# Patient Record
Sex: Male | Born: 2000 | Race: Black or African American | Hispanic: No | Marital: Single | Smoking: Never smoker
Health system: Southern US, Community
[De-identification: ages and names within clinical notes are randomized; demographics above are authoritative.]

---
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
## 2019-11-24 IMAGING — DX DG NASAL BONES 3+V
3 series · 3 of 3 positions shown · non-contrast
Comparison: None.

CLINICAL DATA: Patient states that he was elbowed in the nose today
during football practice, bleeding and pain in the nose. No hx

EXAM:
NASAL BONES - 3+ VIEW

[[person_name]]
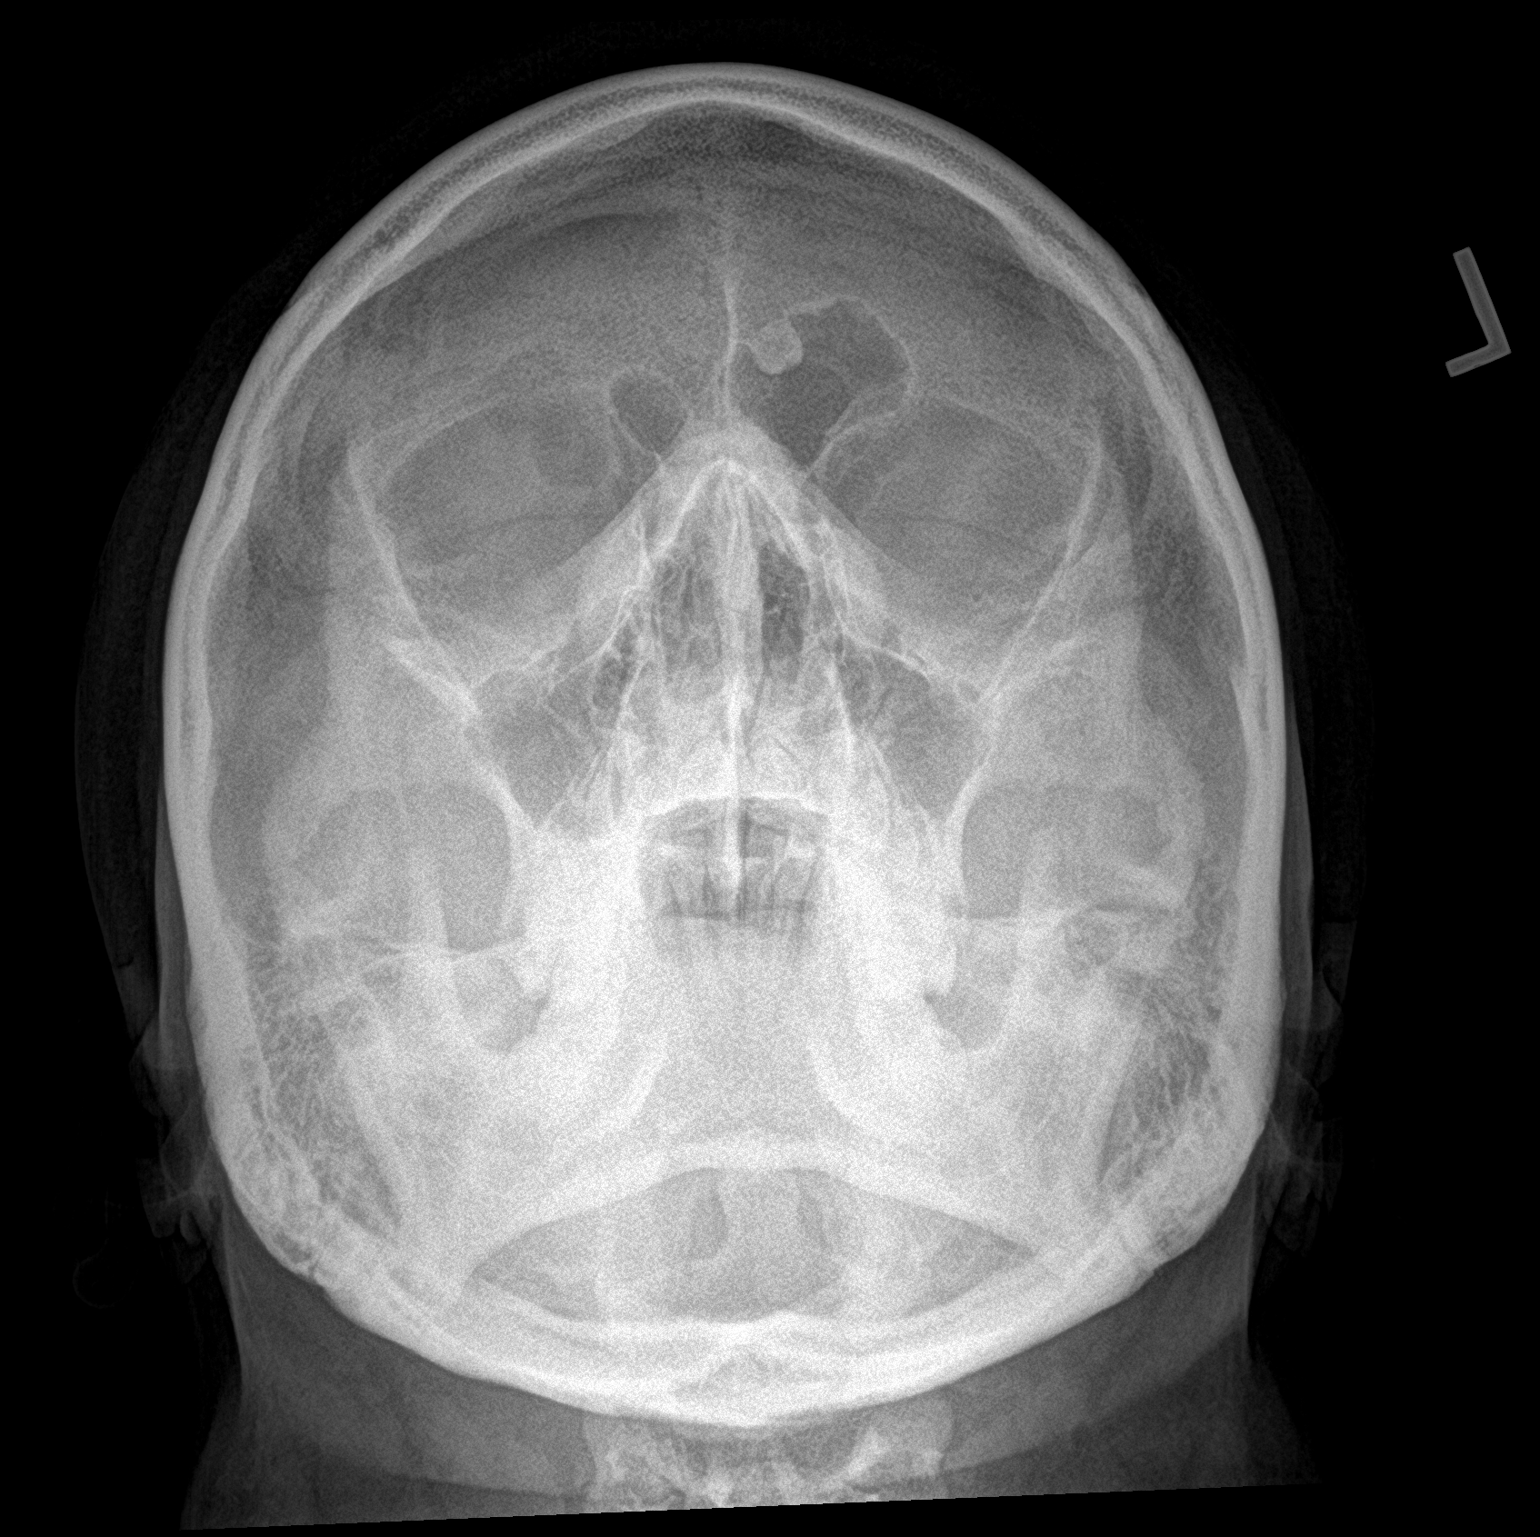

[nasal lat (1 of 2)]
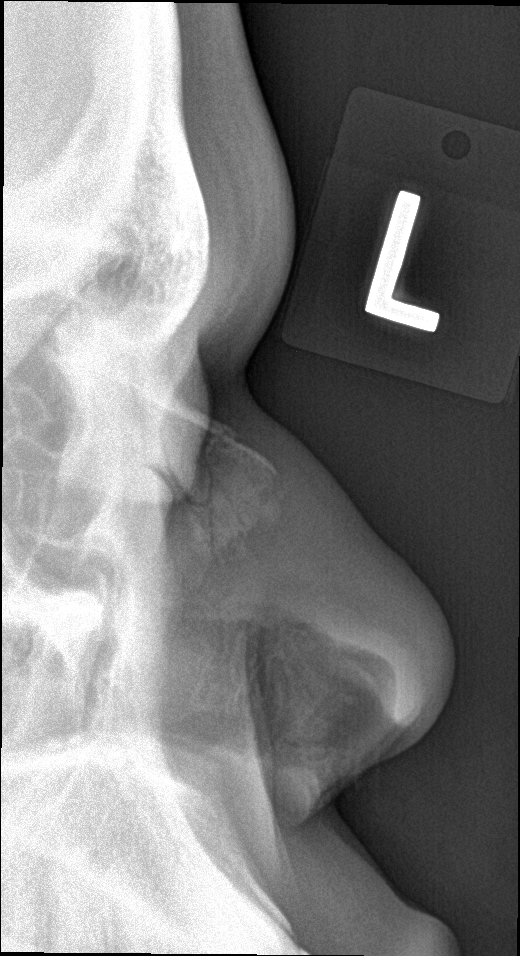

[nasal lat (2 of 2)]
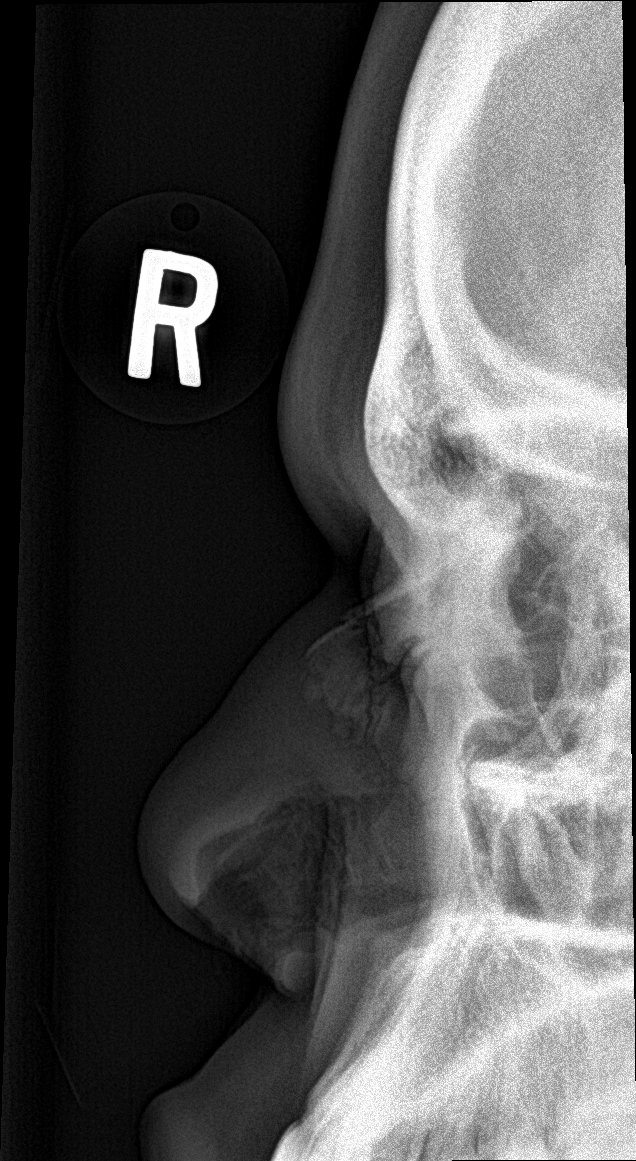

[3 of 3 positions shown; findings below may reference images not displayed]

FINDINGS: There is an acute fracture of the nasal bones bilaterally,
associated with minimal displacement and significant soft tissue
swelling.
IMPRESSION: Bilateral nasal bone fractures.

## 2021-07-23 ENCOUNTER — Encounter (HOSPITAL_COMMUNITY): Payer: Self-pay | Admitting: Emergency Medicine

## 2021-07-23 ENCOUNTER — Ambulatory Visit (HOSPITAL_COMMUNITY)
Admission: EM | Admit: 2021-07-23 | Discharge: 2021-07-23 | Disposition: A | Discharge status: Home / Self Care | Payer: BC Managed Care – PPO | Attending: Physician Assistant | Admitting: Physician Assistant

## 2021-07-23 DIAGNOSIS — R509 Fever, unspecified: Secondary | ICD-10-CM | POA: Diagnosis not present

## 2021-07-23 DIAGNOSIS — G9331 Postviral fatigue syndrome: Secondary | ICD-10-CM | POA: Insufficient documentation

## 2021-07-23 DIAGNOSIS — Z792 Long term (current) use of antibiotics: Secondary | ICD-10-CM | POA: Insufficient documentation

## 2021-07-23 DIAGNOSIS — J029 Acute pharyngitis, unspecified: Secondary | ICD-10-CM | POA: Insufficient documentation

## 2021-07-23 DIAGNOSIS — Z20822 Contact with and (suspected) exposure to covid-19: Secondary | ICD-10-CM | POA: Insufficient documentation

## 2021-07-23 DIAGNOSIS — J020 Streptococcal pharyngitis: Secondary | ICD-10-CM | POA: Diagnosis not present

## 2021-07-23 LAB — POCT RAPID STREP A, ED / UC: Streptococcus, Group A Screen (Direct): NEGATIVE

## 2021-07-23 MED ORDER — LIDOCAINE VISCOUS HCL 2 % MT SOLN: 5.0000 mL | Freq: Four times a day (QID) | OROMUCOSAL | 0 refills | Status: AC

## 2021-07-23 MED ORDER — AMOXICILLIN 500 MG PO CAPS: 500.0000 mg | ORAL_CAPSULE | Freq: Three times a day (TID) | ORAL | 0 refills | Status: AC

## 2021-07-23 NOTE — Discharge Instructions (Addendum)
Advised to take the amoxicillin 500 mg 1 3 times a day until completed to treat the strep throat. Advised to use the Magic mouthwash gargles to help soothe the pain of the sore throat. Advised to continue taking ibuprofen or Tylenol to help reduce the pain the sore throat. Advised to follow-up PCP or return to urgent care if symptoms fail to improve.

## 2021-07-23 NOTE — ED Triage Notes (Signed)
Pt having a sore throat for several days. Reports taking ibuprofen and OTC meds without much relief. Feels stiffness in neck and swollen lymph nodes in neck. Having chills and sweats, not much appetite. Adds has lots of drainage

## 2021-07-23 NOTE — ED Provider Notes (Signed)
[facility]    CSN: [id number] Arrival date & time: 07/23/21  1615      History   Chief Complaint Chief Complaint  Patient presents with   Sore Throat    HPI Dean Huber is a 21 y.o. male.   21 year old presents with sore throat and fever.  Relates for the past several days she has been having increasing sore throat, painful swallowing, with fever ranging from 99-1 01.  Indicates that she is having difficulty swallowing due to the pain, and also his neck hurts due to the throat pain.  Patient indicates that he has mild upper respiratory symptoms of sinus congestion, postnasal drip, and rhinitis.  Patient relates that he has mild chest congestion with cough and that it hurts his throat to cough up the phlegm which is yellow.  Patient relates he has not been around anyone with strep or COVID.  Patient relates he has been taking Advil but it only offers minimal relief.  Patient denies shortness of breath and no nausea or vomiting.   Sore Throat    History reviewed. No pertinent past medical history.  There are no problems to display for this patient.   History reviewed. No pertinent surgical history.     Home Medications    Prior to Admission medications   Medication Sig Start Date End Date Taking? Authorizing Provider  amoxicillin (AMOXIL) 500 MG capsule Take 1 capsule (500 mg total) by mouth 3 (three) times daily. 07/23/21  Yes Ellsworth Lennox, PA-C  magic mouthwash (lidocaine, diphenhydrAMINE, alum & mag hydroxide) suspension Swish and swallow 5 mLs 4 (four) times daily. 07/23/21  Yes Ellsworth Lennox, PA-C  methylphenidate (RITALIN) 20 MG tablet Take 20 mg by mouth 2 (two) times daily.    [provider]    Family History No family history on file.  Social History     Allergies   Patient has no known allergies.   Review of Systems Review of Systems  HENT:  Positive for postnasal drip, sinus pressure and sore throat.      Physical  Exam Triage Vital Signs ED Triage Vitals  Enc Vitals Group     BP 07/23/21 1636 131/67     Pulse Rate 07/23/21 1636 77     Resp 07/23/21 1636 16     Temp 07/23/21 1636 99.3 F (37.4 C)     Temp Source 07/23/21 1636 Oral     SpO2 07/23/21 1636 100 %     Weight --      Height --      Head Circumference --      Peak Flow --      Pain Score 07/23/21 1635 8     Pain Loc --      Pain Edu? --      Excl. in GC? --    No data found.  Updated Vital Signs BP 131/67 (BP Location: Left Arm)   Pulse 77   Temp 99.3 F (37.4 C) (Oral)   Resp 16   SpO2 100%   Visual Acuity Right Eye Distance:   Left Eye Distance:   Bilateral Distance:    Right Eye Near:   Left Eye Near:    Bilateral Near:     Physical Exam Constitutional:      Appearance: He is well-developed.  HENT:     Right Ear: Tympanic membrane and ear canal normal.     Left Ear: Tympanic membrane and ear canal normal.     Mouth/Throat:  Mouth: Mucous membranes are moist.     Pharynx: Pharyngeal swelling, oropharyngeal exudate and posterior oropharyngeal erythema present.     Tonsils: Tonsillar exudate present.  Cardiovascular:     Rate and Rhythm: Normal rate and regular rhythm.     Heart sounds: Normal heart sounds.  Pulmonary:     Effort: Pulmonary effort is normal.     Breath sounds: Normal breath sounds and air entry. No wheezing, rhonchi or rales.  Lymphadenopathy:     Cervical: No cervical adenopathy.  Neurological:     Mental Status: He is alert.      UC Treatments / Results  Labs (all labs ordered are listed, but only abnormal results are displayed) Labs Reviewed  SARS CORONAVIRUS 2 (TAT 6-24 HRS)  CULTURE, GROUP A STREP [facility])  POCT RAPID STREP A, ED / UC    EKG   Radiology No results found.  Procedures Procedures (including critical care time)  Medications Ordered in UC Medications - No data to display  Initial Impression / Assessment and Plan / UC Course  I have reviewed the  triage vital signs and the nursing notes.  Pertinent labs & imaging results that were available during my care of the patient were reviewed by me and considered in my medical decision making (see chart for details).    Plan: 1.  Advised take the amoxicillin 500 mg 1 3 times a day until completed to treat strep throat. 2.  Advised to use the Magic mouthwash gargles to help soothe the pain the sore throat along with lozenges 3.  Advised to take ibuprofen or Tylenol for pain relief and to control the fever. 4.  Covid test is pending 5.  Advised to follow-up with PCP or return to urgent care if symptoms fail to improve Final Clinical Impressions(s) / UC Diagnoses   Final diagnoses:  Sore throat  Fever, unspecified  Postviral fatigue syndrome  Streptococcal sore throat     Discharge Instructions      Advised to take the amoxicillin 500 mg 1 3 times a day until completed to treat the strep throat. Advised to use the Magic mouthwash gargles to help soothe the pain of the sore throat. Advised to continue taking ibuprofen or Tylenol to help reduce the pain the sore throat. Advised to follow-up PCP or return to urgent care if symptoms fail to improve.     ED Prescriptions     Medication Sig Dispense Auth. Provider   magic mouthwash (lidocaine, diphenhydrAMINE, alum & mag hydroxide) suspension Swish and swallow 5 mLs 4 (four) times daily. 100 mL Ellsworth Lennox, PA-C   amoxicillin (AMOXIL) 500 MG capsule Take 1 capsule (500 mg total) by mouth 3 (three) times daily. 21 capsule Ellsworth Lennox, PA-C      PDMP not reviewed this encounter.   Ellsworth Lennox, PA-C 07/23/21 1718

## 2021-07-24 LAB — SARS CORONAVIRUS 2 (TAT 6-24 HRS): SARS Coronavirus 2: NEGATIVE

## 2021-07-26 LAB — CULTURE, GROUP A STREP (THRC)

## 2023-03-04 NOTE — Progress Notes (Signed)
 [address] - AMBULATORY [facility]  - URGENT CARE [facility] [facility] [address] [place] [address]  Date of Service: 03/04/2023 Patient DOB: Mar 09, 2000    History of Present Illness   Patient ID: Dean Huber is a 23 y.o. male. Patient comes in for STI Screening (Patient is requesting a full STI screening. Patient denies symptoms at this time. Patient just found out his girlfriend has an STI, but is unsure which one. ) .  HPI  Dean Huber is a 23 year old male who is here because he was notified by his current girlfriend that she tested positive for a STI.  She did not provide any further information as to whether this was syphilis, HIV, gonorrhea, chlamydia, or trichomoniasis.  He did not inquire further as he was upset with the text message and general situation.  Girlfriend was asymptomatic and tested positive during a pregnancy evaluation it sounds like.  He is asymptomatic.  He is in monogamous relationship with current girlfriend. History of Present Illness   Past Medical History:  Diagnosis Date   Attention deficit disorder with hyperactivity(314.01)     Review of Systems   Review of Systems  Constitutional: Negative.   HENT: Negative.    Eyes: Negative.   Respiratory: Negative.    Cardiovascular: Negative.   Gastrointestinal: Negative.   Endocrine: Negative.   Genitourinary: Negative.   Musculoskeletal: Negative.   Skin: Negative.   Allergic/Immunologic: Negative.   Neurological: Negative.   Hematological: Negative.   Psychiatric/Behavioral: Negative.    All other systems reviewed and are negative.   Physicial Exam   Physical Exam Vitals and nursing note reviewed.  Constitutional:      General: He is not in acute distress.    Appearance: Normal appearance. He is not ill-appearing, toxic-appearing or diaphoretic.  HENT:     Head: Normocephalic and atraumatic.     Right Ear: Tympanic membrane, ear  canal and external ear normal.     Left Ear: Tympanic membrane, ear canal and external ear normal.     Nose: Nose normal.     Mouth/Throat:     Pharynx: Oropharynx is clear.  Eyes:     Conjunctiva/sclera: Conjunctivae normal.  Cardiovascular:     Rate and Rhythm: Normal rate and regular rhythm.     Pulses: Normal pulses.     Heart sounds: Normal heart sounds.  Pulmonary:     Effort: Pulmonary effort is normal.     Breath sounds: Normal breath sounds.  Neurological:     Mental Status: He is alert.     Vitals:   03/04/23 1945  BP: 132/78  Pulse: 91  Resp: 15  Temp: 98.4 F (36.9 C)  SpO2: 100%     Diagnosis   Cailan was seen today for sti screening.  Diagnoses and all orders for this visit:  Possible exposure to STI -     Trichomonas vaginalis, Qualitative NAAT -     Chlamydia / Gonococcus (GC), NAAT; Future     Medical Decision Making    Urgent Care Disposition:  Home Care  Patient cleared for discharge. Discussed return precautions. Instructed on supportive care measures - tylenol/motrin for fever or discomfort, ensure adequate hydration, good hand hygiene to prevent spread of illness. Plan to follow up with PCP in 2-4 days if symptoms are not improving.   If showing URI symptoms, pt was examined in full PPE; mask, goggles, face shield, gown, and gloves.  Hands washed properly before and  after encounter with patient.  Labs   No visits with results within 3 Day(s) from this visit.  Latest known visit with results is:  No results found for any previous visit.    Imaging   No results found for this or any previous visit (from the past 750 hours).  Discharge Information   Symptomatic management discussed.  Patient was given verbal and written instructions on symptoms that necessitate return to the UC/ED, and instructed to f/u w/ UC or PCP if not improving in expected timeframe.   Patient/parent has been instructed on RX/OTC medications, dosages, side  effects, and possible interactions as associated with each diagnosis in my impression and plan above.   Patient education (verbal/handout) given on diagnosis, pathophysiology, treatment of diagnosis, side effects of medication use for treatment, restrictions while taking medication, supportives measures such as staying hydrated. No AVS printed IF Pt opted to receive AVS online via [facility] app and/or [email or web address].    Red Flags associated with diagnosis/es were reviewed and patient instructed on action plan if red flags develop.   They have been instructed that if symptoms worsen or red flags develop they should return to Urgent Care, go to the nearest ED, or activate EMS/911.     Patient and/or parent/guardian (if applicable) agreed with plan and voiced understanding.  No barriers to adherence perceived by myself.  If a new prescription was given today, then I discussed potential side effects, drug interactions, instructions for taking the medication, and the consequences of not taking it.    F/u: Follow up closely with primary care provider (PCP) and other specialists for further care and routine care, but seek medical attention sooner if worsening/concerning signs or symptoms.  Shared decision making with the patient regarding empirically treating him for gonorrhea/chlamydia coverage.  He would like to be tested first and then treated if positive.  Risk/benefits discussed with patient and he verbalized understanding.  We will send off a urine gonorrhea, chlamydia, and trichomonas panel.  He declined HIV and syphilis testing.   Electronically signed by: Caprice Ezra Sheller, NP 03/04/2023 8:21 PM

## 2023-12-31 ENCOUNTER — Ambulatory Visit: Payer: Self-pay

## 2023-12-31 ENCOUNTER — Ambulatory Visit
Admission: EM | Admit: 2023-12-31 | Discharge: 2023-12-31 | Disposition: A | Discharge status: Home / Self Care | Attending: Emergency Medicine | Admitting: Emergency Medicine

## 2023-12-31 ENCOUNTER — Encounter: Payer: Self-pay | Admitting: Emergency Medicine

## 2023-12-31 DIAGNOSIS — L732 Hidradenitis suppurativa: Secondary | ICD-10-CM | POA: Diagnosis not present

## 2023-12-31 MED ORDER — DOXYCYCLINE HYCLATE 100 MG PO TABS: 100.0000 mg | ORAL_TABLET | Freq: Two times a day (BID) | ORAL | 0 refills | Status: AC

## 2023-12-31 NOTE — Discharge Instructions (Signed)
 I have enclosed information about hidradenitis suppurativa at the hip you find helpful.  During your visit today, you received an injection of a steroid called Kenalog and a numbing medication called lidocaine  into the lesion.  The next 12 to 24 hours, you will see a significant reduction in the size and discomfort of the lesion.  I also recommend that you begin a 7-day course of doxycycline to calm the skin and to prevent infection in the lesion as well.  I have sent a prescription to your pharmacy.  Please take 1 tablet twice daily.  In the future, when these lesions appear, it would be very good if you have established care with a dermatologist.  Typically dermatologist will work you in same-day or next day to provide you with an injection of the lesion much like we did today.  Sometimes primary care providers will do this for you.  Sometimes urgent care providers will do this for you.  I will be happy to do this anytime you come in to this location.  Thank you for visiting [facility] today.  We appreciate the opportunity to participate in your care.

## 2023-12-31 NOTE — ED Triage Notes (Signed)
 Pt presents to UC with c/o an abscess under the left axilla. States the abscess has been there awhile but is now starting to be red, swollen and cause pain. Has tried warm compresses with some relief.

## 2023-12-31 NOTE — ED Provider Notes (Signed)
 "    Dean Huber    CSN: [id number] Arrival date & time: 12/31/23  1444    HISTORY   Chief Complaint  Patient presents with   Abscess   HPI Dean Huber is a pleasant, 23 y.o. male who presents to urgent care today. Patient complains of a lesion at his left axilla.  Patient states the abscess itself has been there for a while but over the past day or 2, it has become more red, swollen and painful.  Patient states he has tried warm compresses to the area with some relief.  Patient states he has never had a similar lesion in the past.  Patient states the lesion has not drained.  The history is provided by the patient.  Abscess  History reviewed. No pertinent past medical history. There are no active problems to display for this patient.  History reviewed. No pertinent surgical history.  Home Medications    Prior to Admission medications  Medication Sig Start Date End Date Taking? Authorizing Provider  doxycycline (VIBRA-TABS) 100 MG tablet Take 1 tablet (100 mg total) by mouth 2 (two) times daily for 7 days. 12/31/23 01/07/24 Yes Joesph Shaver Scales, PA-C  amoxicillin  (AMOXIL ) 500 MG capsule Take 1 capsule (500 mg total) by mouth 3 (three) times daily. 07/23/21   Lynwood Lenis, PA-C  magic mouthwash (lidocaine , diphenhydrAMINE, alum & mag hydroxide) suspension Swish and swallow 5 mLs 4 (four) times daily. 07/23/21   Lynwood Lenis, PA-C  methylphenidate (RITALIN) 20 MG tablet Take 20 mg by mouth 2 (two) times daily.    [provider]    Family History History reviewed. No pertinent family history. Social History Social History[1] Allergies   Patient has no known allergies.  Review of Systems Review of Systems Pertinent findings revealed after performing a 14 point review of systems has been noted in the history of present illness.  Physical Exam Vital Signs BP 117/70 (BP Location: Right Arm)   Pulse 77   Temp 98.6 F (37 C) (Oral)   Resp 18   SpO2 97%    No data found.  Physical Exam Vitals and nursing note reviewed.  Constitutional:      General: He is awake. He is not in acute distress.    Appearance: Normal appearance. He is well-developed and well-groomed. He is not ill-appearing.  Skin:    Findings: Lesion (Left axilla, 2 cm x 2 cm with central punctate, area is nonfluctuant, warm to touch, mildly erythematous, no drainage appreciated) present.  Neurological:     Mental Status: He is alert.  Psychiatric:        Behavior: Behavior is cooperative.     Visual Acuity Right Eye Distance:   Left Eye Distance:   Bilateral Distance:    Right Eye Near:   Left Eye Near:    Bilateral Near:     Huber Couse / Diagnostics / Procedures:     Radiology No results found.  Procedures Wound Care  Date/Time: 12/31/2023 4:25 PM  Performed by: Joesph Shaver Scales, PA-C Authorized by: Joesph Shaver Scales, PA-C   Comments:     Lesion of left axilla was injected with 0.5 cc of a mixture of 1 part Kenalog 40 mg/mL and 3 parts lidocaine  2%.  Patient tolerated well.  (including critical care time) EKG  Pending results:  Labs Reviewed - No data to display  Medications Ordered in Huber: Medications - No data to display  Huber Diagnoses / Final Clinical Impressions(s)  I have reviewed the triage vital signs and the nursing notes.  Pertinent labs & imaging results that were available during my care of the patient were reviewed by me and considered in my medical decision making (see chart for details).    Final diagnoses:  Hidradenitis suppurativa of left axilla   Patient was provided with an intralesional corticosteroid injection and a 7-day course of doxycycline.  Patient encouraged to reach out to primary care provider to discuss referral to dermatology for management of future lesions.  Conservative care recommended.  Return precautions advised.  Please see discharge instructions below for details of plan of care as provided to  patient. ED Prescriptions     Medication Sig Dispense Auth. Provider   doxycycline (VIBRA-TABS) 100 MG tablet Take 1 tablet (100 mg total) by mouth 2 (two) times daily for 7 days. 14 tablet Joesph Shaver Scales, PA-C      PDMP not reviewed this encounter.    Discharge Instructions      I have enclosed information about hidradenitis suppurativa at the hip you find helpful.  During your visit today, you received an injection of a steroid called Kenalog and a numbing medication called lidocaine  into the lesion.  The next 12 to 24 hours, you will see a significant reduction in the size and discomfort of the lesion.  I also recommend that you begin a 7-day course of doxycycline to calm the skin and to prevent infection in the lesion as well.  I have sent a prescription to your pharmacy.  Please take 1 tablet twice daily.  In the future, when these lesions appear, it would be very good if you have established care with a dermatologist.  Typically dermatologist will work you in same-day or next day to provide you with an injection of the lesion much like we did today.  Sometimes primary care providers will do this for you.  Sometimes urgent care providers will do this for you.  I will be happy to do this anytime you come in to this location.  Thank you for visiting [facility] today.  We appreciate the opportunity to participate in your care.    Disposition Upon Discharge:  Condition: stable for discharge home  Patient presented with an acute illness with associated systemic symptoms and significant discomfort requiring urgent management. In my opinion, this is a condition that a prudent lay person (someone who possesses an average knowledge of health and medicine) may potentially expect to result in complications if not addressed urgently such as respiratory distress, impairment of bodily function or dysfunction of bodily organs.   Routine symptom specific, illness specific  and/or disease specific instructions were discussed with the patient and/or caregiver at length.   As such, the patient has been evaluated and assessed, work-up was performed and treatment was provided in alignment with urgent care protocols and evidence based medicine.  Patient/parent/caregiver has been advised that the patient may require follow up for further testing and treatment if the symptoms continue in spite of treatment, as clinically indicated and appropriate.  Patient/parent/caregiver has been advised to return to the [facility] or PCP if no better; to PCP or the Emergency Department if new signs and symptoms develop, or if the current signs or symptoms continue to change or worsen for further workup, evaluation and treatment as clinically indicated and appropriate  The patient will follow up with their current PCP if and as advised. If the patient does not currently have a PCP we will  assist them in obtaining one.   The patient may need specialty follow up if the symptoms continue, in spite of conservative treatment and management, for further workup, evaluation, consultation and treatment as clinically indicated and appropriate.  Patient/parent/caregiver verbalized understanding and agreement of plan as discussed.  All questions were addressed during visit.  Please see discharge instructions below for further details of plan.  This office note has been dictated using Teaching laboratory technician.  Unfortunately, this method of dictation can sometimes lead to typographical or grammatical errors.  I apologize for your inconvenience in advance if this occurs.  Please do not hesitate to reach out to me if clarification is needed.       [1]  Social History Tobacco Use   Smoking status: Never   Smokeless tobacco: Never     Joesph Shaver Scales, PA-C 12/31/23 1626  "
